# Patient Record
Sex: Female | Born: 1940 | Race: White | Hispanic: No | Marital: Married
Health system: Southern US, Community
[De-identification: ages and names within clinical notes are randomized; demographics above are authoritative.]

## PROBLEM LIST (undated history)

## (undated) HISTORY — PX: BREAST CYST EXCISION: SHX579

---

## 1998-09-11 ENCOUNTER — Encounter: Admission: RE | Admit: 1998-09-11 | Discharge: 1998-12-10 | Payer: Self-pay | Admitting: Internal Medicine

## 1999-06-19 ENCOUNTER — Other Ambulatory Visit: Admission: RE | Admit: 1999-06-19 | Discharge: 1999-06-19 | Payer: Self-pay | Admitting: *Deleted

## 2001-07-01 ENCOUNTER — Other Ambulatory Visit: Admission: RE | Admit: 2001-07-01 | Discharge: 2001-07-01 | Payer: Self-pay | Admitting: *Deleted

## 2003-02-05 ENCOUNTER — Ambulatory Visit (HOSPITAL_COMMUNITY): Admission: RE | Admit: 2003-02-05 | Discharge: 2003-02-05 | Payer: Self-pay | Admitting: Gastroenterology

## 2006-09-06 ENCOUNTER — Emergency Department (HOSPITAL_COMMUNITY): Admission: EM | Admit: 2006-09-06 | Discharge: 2006-09-06 | Payer: Self-pay | Admitting: Emergency Medicine

## 2009-04-16 ENCOUNTER — Encounter: Admission: RE | Admit: 2009-04-16 | Discharge: 2009-04-16 | Payer: Self-pay | Admitting: Obstetrics

## 2010-03-05 ENCOUNTER — Encounter: Admission: RE | Admit: 2010-03-05 | Discharge: 2010-03-05 | Payer: Self-pay | Admitting: Obstetrics

## 2011-02-06 ENCOUNTER — Other Ambulatory Visit: Payer: Self-pay | Admitting: Obstetrics

## 2011-02-06 DIAGNOSIS — Z1231 Encounter for screening mammogram for malignant neoplasm of breast: Secondary | ICD-10-CM

## 2011-02-06 NOTE — Op Note (Signed)
NAME:  BALERIA, WYMAN                              ACCOUNT NO.:  [id number]   MEDICAL RECORD NO.:  [id number]                   PATIENT TYPE:  AMB   LOCATION:  ENDO                                 FACILITY:  [facility]   PHYSICIAN:  Anselmo Rod, M.D.               DATE OF BIRTH:  12/25/1940   DATE OF PROCEDURE:  02/05/2003  DATE OF DISCHARGE:                                 OPERATIVE REPORT   PROCEDURE PERFORMED:  Screening colonoscopy.   ENDOSCOPIST:  Anselmo Rod, M.D.   INSTRUMENT USED:  Olympus video colonoscope.   INDICATIONS FOR PROCEDURE:  A 70 year old white female with a personal  history of insulin-dependent diabetes undergoing screening colonoscopy to  rule out colonic polyps, masses, etc.   PREPROCEDURE PREPARATION:  Informed consent was secured from the patient.  The patient fasted for 8 hours prior to the procedure and prepared with a  bottle of magnesium citrate and a gallon of GoLytely the night prior to  procedure.   PREPROCEDURE PHYSICAL:  VITAL SIGNS:  The patient had stable vital signs.  NECK:  Supple.  CHEST:  Clear to auscultation.  HEART:  S1, S2 regular.  ABDOMEN:  Soft with normal bowel sounds.  Insulin pump present.   DESCRIPTION OF PROCEDURE:  The patient was placed in the left lateral  decubitus position and sedated with 50 mg of Demerol and 5 mg of Versed  intravenously.  Once the patient was adequately sedated and maintained on  low-flow oxygen, and continuous cardiac monitoring, the Olympus video  colonoscope was advanced into the rectum to the cecum and terminal ileum  with difficulty.  The entire colonic mucosa appeared healthy with a normal  vascular pattern.  No masses, polyps, erosions, ulcerations, or diverticula  were seen.  The appendiceal orifice and ileocecal valve were clearly  visualized and photographed.  The terminal ileum appeared healthy and  without lesions.  Small nonbleeding internal hemorrhoids were seen on  retroflexion of  the rectum.  The patient tolerated the procedure well  without complications.   IMPRESSION:  Normal colonoscopy up to the terminal ileum except for small  internal hemorrhoids.   RECOMMENDATIONS:  1. A high fiber diet with liberal fluid intake has been recommended.  2.     Repeat colorectal screening is recommended in the next 10 years unless the     patient develops any abnormal symptoms in the interim.  3. Outpatient followup on a p.r.n. basis.                                               Anselmo Rod, M.D.    JNM/MEDQ  D:  02/05/2003  T:  02/05/2003  Job:  [id number]   cc:   Gerri Spore B. Earlene Plater, M.D.  [address]  [place]  [place] [address]  Fax: [phone]

## 2011-03-10 ENCOUNTER — Ambulatory Visit: Payer: Self-pay

## 2011-03-24 ENCOUNTER — Ambulatory Visit
Admission: RE | Admit: 2011-03-24 | Discharge: 2011-03-24 | Disposition: A | Discharge status: Home / Self Care | Payer: Medicare Other | Source: Ambulatory Visit | Attending: Obstetrics | Admitting: Obstetrics

## 2011-03-24 DIAGNOSIS — Z1231 Encounter for screening mammogram for malignant neoplasm of breast: Secondary | ICD-10-CM

## 2012-02-17 ENCOUNTER — Other Ambulatory Visit: Payer: Self-pay | Admitting: Obstetrics

## 2012-02-17 DIAGNOSIS — Z1231 Encounter for screening mammogram for malignant neoplasm of breast: Secondary | ICD-10-CM

## 2012-03-31 ENCOUNTER — Ambulatory Visit
Admission: RE | Admit: 2012-03-31 | Discharge: 2012-03-31 | Disposition: A | Discharge status: Home / Self Care | Payer: BC Managed Care – PPO | Source: Ambulatory Visit | Attending: Obstetrics | Admitting: Obstetrics

## 2012-03-31 DIAGNOSIS — Z1231 Encounter for screening mammogram for malignant neoplasm of breast: Secondary | ICD-10-CM

## 2013-02-21 ENCOUNTER — Other Ambulatory Visit: Payer: Self-pay

## 2013-02-21 DIAGNOSIS — Z1231 Encounter for screening mammogram for malignant neoplasm of breast: Secondary | ICD-10-CM

## 2013-04-06 ENCOUNTER — Ambulatory Visit
Admission: RE | Admit: 2013-04-06 | Discharge: 2013-04-06 | Disposition: A | Discharge status: Home / Self Care | Payer: Medicare Other | Source: Ambulatory Visit

## 2013-04-06 DIAGNOSIS — Z1231 Encounter for screening mammogram for malignant neoplasm of breast: Secondary | ICD-10-CM

## 2014-02-28 ENCOUNTER — Other Ambulatory Visit: Payer: Self-pay

## 2014-02-28 DIAGNOSIS — Z1231 Encounter for screening mammogram for malignant neoplasm of breast: Secondary | ICD-10-CM

## 2014-04-09 ENCOUNTER — Encounter (INDEPENDENT_AMBULATORY_CARE_PROVIDER_SITE_OTHER): Payer: Self-pay

## 2014-04-09 ENCOUNTER — Ambulatory Visit
Admission: RE | Admit: 2014-04-09 | Discharge: 2014-04-09 | Disposition: A | Discharge status: Home / Self Care | Payer: 59 | Source: Ambulatory Visit

## 2014-04-09 DIAGNOSIS — Z1231 Encounter for screening mammogram for malignant neoplasm of breast: Secondary | ICD-10-CM

## 2014-04-12 ENCOUNTER — Ambulatory Visit: Payer: Medicare Other

## 2014-06-15 ENCOUNTER — Ambulatory Visit: Payer: Self-pay | Admitting: Unknown Physician Specialty

## 2014-09-20 ENCOUNTER — Encounter: Payer: Self-pay | Admitting: Unknown Physician Specialty

## 2014-09-21 ENCOUNTER — Encounter: Payer: Self-pay | Admitting: Unknown Physician Specialty

## 2014-10-22 ENCOUNTER — Encounter: Payer: Self-pay | Admitting: Unknown Physician Specialty

## 2014-11-20 ENCOUNTER — Encounter: Payer: Self-pay | Admitting: Unknown Physician Specialty

## 2015-03-15 ENCOUNTER — Other Ambulatory Visit: Payer: Self-pay

## 2015-03-15 DIAGNOSIS — Z1231 Encounter for screening mammogram for malignant neoplasm of breast: Secondary | ICD-10-CM

## 2015-04-15 ENCOUNTER — Ambulatory Visit
Admission: RE | Admit: 2015-04-15 | Discharge: 2015-04-15 | Disposition: A | Discharge status: Home / Self Care | Payer: Medicare Other | Source: Ambulatory Visit

## 2015-04-15 DIAGNOSIS — Z1231 Encounter for screening mammogram for malignant neoplasm of breast: Secondary | ICD-10-CM

## 2016-04-29 ENCOUNTER — Other Ambulatory Visit: Payer: Self-pay | Admitting: Internal Medicine

## 2016-04-29 DIAGNOSIS — Z1231 Encounter for screening mammogram for malignant neoplasm of breast: Secondary | ICD-10-CM

## 2016-05-01 ENCOUNTER — Ambulatory Visit
Admission: RE | Admit: 2016-05-01 | Discharge: 2016-05-01 | Disposition: A | Discharge status: Home / Self Care | Payer: Medicare Other | Source: Ambulatory Visit | Attending: Internal Medicine | Admitting: Internal Medicine

## 2016-05-01 DIAGNOSIS — Z1231 Encounter for screening mammogram for malignant neoplasm of breast: Secondary | ICD-10-CM

## 2017-06-11 ENCOUNTER — Other Ambulatory Visit: Payer: Self-pay | Admitting: Internal Medicine

## 2017-06-11 DIAGNOSIS — Z1231 Encounter for screening mammogram for malignant neoplasm of breast: Secondary | ICD-10-CM

## 2017-06-18 ENCOUNTER — Ambulatory Visit
Admission: RE | Admit: 2017-06-18 | Discharge: 2017-06-18 | Disposition: A | Discharge status: Home / Self Care | Payer: Medicare Other | Source: Ambulatory Visit | Attending: Internal Medicine | Admitting: Internal Medicine

## 2017-06-18 DIAGNOSIS — Z1231 Encounter for screening mammogram for malignant neoplasm of breast: Secondary | ICD-10-CM

## 2017-08-16 ENCOUNTER — Telehealth: Payer: Self-pay | Admitting: Podiatry

## 2017-08-16 NOTE — Telephone Encounter (Signed)
The purpose of this call is to know the first and last date that I saw Dr. Charlsie Merlesegal which I think was around 2003. I would like to know if I had injured my toe and what he said. You can call me back at [phone]. Thanks for your help and I hope to hear from you soon.

## 2017-08-16 NOTE — Telephone Encounter (Signed)
Tried calling pt back in regards to messages left. Left a voicemail letting the pt know that our old system before 21 June 2013 has crashed and that we do not have access to those records. I informed her I called the facility where are records are stored and they do not have a file on her but also I do not specific information to give them to look directly in an area for her chart, but that by pulling her name they did not have anything. I also told her that normally after 7 years they can destroy the records, that does not mean that they have been but I cannot say for sure. I apologized for any inconvenience this may have caused her and told her to call me with any questions. I gave her my direct phone number.

## 2017-08-16 NOTE — Telephone Encounter (Signed)
I'm a former pt of Dr. Beverlee Nimsegal's. I was wanting to know the first and last appointment with Dr. Charlsie Merlesegal. If you could call and let me know my first and last visit with Dr. Charlsie Merlesegal as well as the diagnosis. My number is [phone]. The last time I was in your office was 2003 I believe. Thanks for your help.

## 2018-05-19 ENCOUNTER — Other Ambulatory Visit: Payer: Self-pay | Admitting: Internal Medicine

## 2018-05-19 DIAGNOSIS — Z1231 Encounter for screening mammogram for malignant neoplasm of breast: Secondary | ICD-10-CM

## 2018-06-23 ENCOUNTER — Ambulatory Visit
Admission: RE | Admit: 2018-06-23 | Discharge: 2018-06-23 | Disposition: A | Discharge status: Home / Self Care | Payer: Medicare Other | Source: Ambulatory Visit | Attending: Internal Medicine | Admitting: Internal Medicine

## 2018-06-23 ENCOUNTER — Ambulatory Visit: Payer: Medicare Other

## 2018-06-23 DIAGNOSIS — Z1231 Encounter for screening mammogram for malignant neoplasm of breast: Secondary | ICD-10-CM

## 2019-05-15 ENCOUNTER — Other Ambulatory Visit: Payer: Self-pay | Admitting: Internal Medicine

## 2019-05-15 DIAGNOSIS — Z1231 Encounter for screening mammogram for malignant neoplasm of breast: Secondary | ICD-10-CM

## 2019-06-30 ENCOUNTER — Ambulatory Visit
Admission: RE | Admit: 2019-06-30 | Discharge: 2019-06-30 | Disposition: A | Discharge status: Home / Self Care | Payer: Medicare Other | Source: Ambulatory Visit | Attending: Internal Medicine | Admitting: Internal Medicine

## 2019-06-30 ENCOUNTER — Other Ambulatory Visit: Payer: Self-pay

## 2019-06-30 DIAGNOSIS — Z1231 Encounter for screening mammogram for malignant neoplasm of breast: Secondary | ICD-10-CM

## 2020-04-08 DIAGNOSIS — E871 Hypo-osmolality and hyponatremia: Secondary | ICD-10-CM | POA: Diagnosis not present

## 2020-04-08 DIAGNOSIS — E10319 Type 1 diabetes mellitus with unspecified diabetic retinopathy without macular edema: Secondary | ICD-10-CM | POA: Diagnosis not present

## 2020-04-08 DIAGNOSIS — Z794 Long term (current) use of insulin: Secondary | ICD-10-CM | POA: Diagnosis not present

## 2020-04-08 DIAGNOSIS — I951 Orthostatic hypotension: Secondary | ICD-10-CM | POA: Diagnosis not present

## 2020-04-08 DIAGNOSIS — I77810 Thoracic aortic ectasia: Secondary | ICD-10-CM | POA: Diagnosis not present

## 2020-04-08 DIAGNOSIS — M25561 Pain in right knee: Secondary | ICD-10-CM | POA: Diagnosis not present

## 2020-04-10 DIAGNOSIS — H35372 Puckering of macula, left eye: Secondary | ICD-10-CM | POA: Diagnosis not present

## 2020-04-10 DIAGNOSIS — E103552 Type 1 diabetes mellitus with stable proliferative diabetic retinopathy, left eye: Secondary | ICD-10-CM | POA: Diagnosis not present

## 2020-04-10 DIAGNOSIS — H353121 Nonexudative age-related macular degeneration, left eye, early dry stage: Secondary | ICD-10-CM | POA: Diagnosis not present

## 2020-04-10 DIAGNOSIS — H43822 Vitreomacular adhesion, left eye: Secondary | ICD-10-CM | POA: Diagnosis not present

## 2020-04-16 DIAGNOSIS — E109 Type 1 diabetes mellitus without complications: Secondary | ICD-10-CM | POA: Diagnosis not present

## 2020-05-31 ENCOUNTER — Other Ambulatory Visit: Payer: Self-pay | Admitting: Internal Medicine

## 2020-05-31 DIAGNOSIS — Z1231 Encounter for screening mammogram for malignant neoplasm of breast: Secondary | ICD-10-CM

## 2020-07-09 ENCOUNTER — Ambulatory Visit
Admission: RE | Admit: 2020-07-09 | Discharge: 2020-07-09 | Disposition: A | Discharge status: Home / Self Care | Payer: Medicare Other | Source: Ambulatory Visit | Attending: Internal Medicine | Admitting: Internal Medicine

## 2020-07-09 ENCOUNTER — Other Ambulatory Visit: Payer: Self-pay

## 2020-07-09 DIAGNOSIS — M810 Age-related osteoporosis without current pathological fracture: Secondary | ICD-10-CM | POA: Diagnosis not present

## 2020-07-09 DIAGNOSIS — Z1231 Encounter for screening mammogram for malignant neoplasm of breast: Secondary | ICD-10-CM

## 2020-07-09 DIAGNOSIS — E10319 Type 1 diabetes mellitus with unspecified diabetic retinopathy without macular edema: Secondary | ICD-10-CM | POA: Diagnosis not present

## 2020-07-09 DIAGNOSIS — Z0000 Encounter for general adult medical examination without abnormal findings: Secondary | ICD-10-CM | POA: Diagnosis not present

## 2020-07-09 DIAGNOSIS — I77810 Thoracic aortic ectasia: Secondary | ICD-10-CM | POA: Diagnosis not present

## 2020-07-09 DIAGNOSIS — Z794 Long term (current) use of insulin: Secondary | ICD-10-CM | POA: Diagnosis not present

## 2020-07-09 DIAGNOSIS — E871 Hypo-osmolality and hyponatremia: Secondary | ICD-10-CM | POA: Diagnosis not present

## 2020-07-09 DIAGNOSIS — D692 Other nonthrombocytopenic purpura: Secondary | ICD-10-CM | POA: Diagnosis not present

## 2020-07-09 DIAGNOSIS — I951 Orthostatic hypotension: Secondary | ICD-10-CM | POA: Diagnosis not present

## 2020-07-09 DIAGNOSIS — E103599 Type 1 diabetes mellitus with proliferative diabetic retinopathy without macular edema, unspecified eye: Secondary | ICD-10-CM | POA: Diagnosis not present

## 2020-07-10 DIAGNOSIS — H40022 Open angle with borderline findings, high risk, left eye: Secondary | ICD-10-CM | POA: Diagnosis not present

## 2020-07-11 DIAGNOSIS — Z1212 Encounter for screening for malignant neoplasm of rectum: Secondary | ICD-10-CM | POA: Diagnosis not present

## 2020-07-11 DIAGNOSIS — R82998 Other abnormal findings in urine: Secondary | ICD-10-CM | POA: Diagnosis not present

## 2020-10-07 DIAGNOSIS — E109 Type 1 diabetes mellitus without complications: Secondary | ICD-10-CM | POA: Diagnosis not present

## 2020-10-18 DIAGNOSIS — Z794 Long term (current) use of insulin: Secondary | ICD-10-CM | POA: Diagnosis not present

## 2020-10-18 DIAGNOSIS — E103599 Type 1 diabetes mellitus with proliferative diabetic retinopathy without macular edema, unspecified eye: Secondary | ICD-10-CM | POA: Diagnosis not present

## 2020-10-18 DIAGNOSIS — E10319 Type 1 diabetes mellitus with unspecified diabetic retinopathy without macular edema: Secondary | ICD-10-CM | POA: Diagnosis not present

## 2020-10-18 DIAGNOSIS — I951 Orthostatic hypotension: Secondary | ICD-10-CM | POA: Diagnosis not present

## 2020-10-18 DIAGNOSIS — I77810 Thoracic aortic ectasia: Secondary | ICD-10-CM | POA: Diagnosis not present

## 2020-10-18 DIAGNOSIS — E109 Type 1 diabetes mellitus without complications: Secondary | ICD-10-CM | POA: Diagnosis not present

## 2020-10-18 DIAGNOSIS — H3530 Unspecified macular degeneration: Secondary | ICD-10-CM | POA: Diagnosis not present

## 2020-10-18 DIAGNOSIS — M810 Age-related osteoporosis without current pathological fracture: Secondary | ICD-10-CM | POA: Diagnosis not present

## 2020-10-18 DIAGNOSIS — D329 Benign neoplasm of meninges, unspecified: Secondary | ICD-10-CM | POA: Diagnosis not present

## 2020-10-18 DIAGNOSIS — D692 Other nonthrombocytopenic purpura: Secondary | ICD-10-CM | POA: Diagnosis not present

## 2020-12-24 DIAGNOSIS — E1065 Type 1 diabetes mellitus with hyperglycemia: Secondary | ICD-10-CM | POA: Diagnosis not present

## 2020-12-24 DIAGNOSIS — E133519 Other specified diabetes mellitus with proliferative diabetic retinopathy with macular edema, unspecified eye: Secondary | ICD-10-CM | POA: Diagnosis not present

## 2020-12-24 DIAGNOSIS — E559 Vitamin D deficiency, unspecified: Secondary | ICD-10-CM | POA: Diagnosis not present

## 2020-12-24 DIAGNOSIS — E1042 Type 1 diabetes mellitus with diabetic polyneuropathy: Secondary | ICD-10-CM | POA: Diagnosis not present

## 2020-12-24 DIAGNOSIS — R5383 Other fatigue: Secondary | ICD-10-CM | POA: Diagnosis not present

## 2021-01-06 DIAGNOSIS — E109 Type 1 diabetes mellitus without complications: Secondary | ICD-10-CM | POA: Diagnosis not present

## 2021-04-16 DIAGNOSIS — E1041 Type 1 diabetes mellitus with diabetic mononeuropathy: Secondary | ICD-10-CM | POA: Diagnosis not present

## 2021-04-16 DIAGNOSIS — E1065 Type 1 diabetes mellitus with hyperglycemia: Secondary | ICD-10-CM | POA: Diagnosis not present

## 2021-04-16 DIAGNOSIS — E133519 Other specified diabetes mellitus with proliferative diabetic retinopathy with macular edema, unspecified eye: Secondary | ICD-10-CM | POA: Diagnosis not present

## 2021-04-29 DIAGNOSIS — E109 Type 1 diabetes mellitus without complications: Secondary | ICD-10-CM | POA: Diagnosis not present

## 2021-04-30 DIAGNOSIS — H353121 Nonexudative age-related macular degeneration, left eye, early dry stage: Secondary | ICD-10-CM | POA: Diagnosis not present

## 2021-04-30 DIAGNOSIS — H40022 Open angle with borderline findings, high risk, left eye: Secondary | ICD-10-CM | POA: Diagnosis not present

## 2021-04-30 DIAGNOSIS — E103552 Type 1 diabetes mellitus with stable proliferative diabetic retinopathy, left eye: Secondary | ICD-10-CM | POA: Diagnosis not present

## 2021-04-30 DIAGNOSIS — H35372 Puckering of macula, left eye: Secondary | ICD-10-CM | POA: Diagnosis not present

## 2021-04-30 DIAGNOSIS — S0571XD Avulsion of right eye, subsequent encounter: Secondary | ICD-10-CM | POA: Diagnosis not present

## 2021-04-30 DIAGNOSIS — H43822 Vitreomacular adhesion, left eye: Secondary | ICD-10-CM | POA: Diagnosis not present

## 2021-06-03 ENCOUNTER — Other Ambulatory Visit: Payer: Self-pay | Admitting: Internal Medicine

## 2021-06-03 DIAGNOSIS — Z1231 Encounter for screening mammogram for malignant neoplasm of breast: Secondary | ICD-10-CM

## 2021-07-16 ENCOUNTER — Ambulatory Visit: Payer: Medicare PPO

## 2021-07-17 ENCOUNTER — Ambulatory Visit
Admission: RE | Admit: 2021-07-17 | Discharge: 2021-07-17 | Disposition: A | Discharge status: Home / Self Care | Payer: Medicare PPO | Source: Ambulatory Visit | Attending: Internal Medicine | Admitting: Internal Medicine

## 2021-07-17 ENCOUNTER — Other Ambulatory Visit: Payer: Self-pay

## 2021-07-17 DIAGNOSIS — Z1231 Encounter for screening mammogram for malignant neoplasm of breast: Secondary | ICD-10-CM | POA: Diagnosis not present

## 2021-07-30 DIAGNOSIS — E109 Type 1 diabetes mellitus without complications: Secondary | ICD-10-CM | POA: Diagnosis not present

## 2021-08-25 DIAGNOSIS — E1065 Type 1 diabetes mellitus with hyperglycemia: Secondary | ICD-10-CM | POA: Diagnosis not present

## 2021-08-25 DIAGNOSIS — E1043 Type 1 diabetes mellitus with diabetic autonomic (poly)neuropathy: Secondary | ICD-10-CM | POA: Diagnosis not present

## 2021-08-25 DIAGNOSIS — E559 Vitamin D deficiency, unspecified: Secondary | ICD-10-CM | POA: Diagnosis not present

## 2021-08-25 DIAGNOSIS — R5383 Other fatigue: Secondary | ICD-10-CM | POA: Diagnosis not present

## 2021-09-23 DIAGNOSIS — H35372 Puckering of macula, left eye: Secondary | ICD-10-CM | POA: Diagnosis not present

## 2021-09-23 DIAGNOSIS — E103552 Type 1 diabetes mellitus with stable proliferative diabetic retinopathy, left eye: Secondary | ICD-10-CM | POA: Diagnosis not present

## 2021-09-23 DIAGNOSIS — H43822 Vitreomacular adhesion, left eye: Secondary | ICD-10-CM | POA: Diagnosis not present

## 2021-09-23 DIAGNOSIS — H353121 Nonexudative age-related macular degeneration, left eye, early dry stage: Secondary | ICD-10-CM | POA: Diagnosis not present

## 2021-09-23 DIAGNOSIS — H40022 Open angle with borderline findings, high risk, left eye: Secondary | ICD-10-CM | POA: Diagnosis not present

## 2021-11-17 DIAGNOSIS — E109 Type 1 diabetes mellitus without complications: Secondary | ICD-10-CM | POA: Diagnosis not present

## 2021-12-11 DIAGNOSIS — E1065 Type 1 diabetes mellitus with hyperglycemia: Secondary | ICD-10-CM | POA: Diagnosis not present

## 2021-12-11 DIAGNOSIS — M816 Localized osteoporosis [Lequesne]: Secondary | ICD-10-CM | POA: Diagnosis not present

## 2021-12-17 DIAGNOSIS — M816 Localized osteoporosis [Lequesne]: Secondary | ICD-10-CM | POA: Diagnosis not present

## 2022-02-17 DIAGNOSIS — E109 Type 1 diabetes mellitus without complications: Secondary | ICD-10-CM | POA: Diagnosis not present

## 2022-04-01 DIAGNOSIS — H43822 Vitreomacular adhesion, left eye: Secondary | ICD-10-CM | POA: Diagnosis not present

## 2022-04-01 DIAGNOSIS — E103552 Type 1 diabetes mellitus with stable proliferative diabetic retinopathy, left eye: Secondary | ICD-10-CM | POA: Diagnosis not present

## 2022-04-01 DIAGNOSIS — H40022 Open angle with borderline findings, high risk, left eye: Secondary | ICD-10-CM | POA: Diagnosis not present

## 2022-04-01 DIAGNOSIS — H35372 Puckering of macula, left eye: Secondary | ICD-10-CM | POA: Diagnosis not present

## 2022-04-01 DIAGNOSIS — H353121 Nonexudative age-related macular degeneration, left eye, early dry stage: Secondary | ICD-10-CM | POA: Diagnosis not present

## 2022-04-13 DIAGNOSIS — M8000XS Age-related osteoporosis with current pathological fracture, unspecified site, sequela: Secondary | ICD-10-CM | POA: Diagnosis not present

## 2022-04-13 DIAGNOSIS — E1065 Type 1 diabetes mellitus with hyperglycemia: Secondary | ICD-10-CM | POA: Diagnosis not present

## 2022-04-13 DIAGNOSIS — E1042 Type 1 diabetes mellitus with diabetic polyneuropathy: Secondary | ICD-10-CM | POA: Diagnosis not present

## 2022-04-16 DIAGNOSIS — H903 Sensorineural hearing loss, bilateral: Secondary | ICD-10-CM | POA: Diagnosis not present

## 2022-04-20 DIAGNOSIS — L720 Epidermal cyst: Secondary | ICD-10-CM | POA: Diagnosis not present

## 2022-05-08 DIAGNOSIS — S0571XD Avulsion of right eye, subsequent encounter: Secondary | ICD-10-CM | POA: Diagnosis not present

## 2022-05-19 DIAGNOSIS — E109 Type 1 diabetes mellitus without complications: Secondary | ICD-10-CM | POA: Diagnosis not present

## 2022-06-08 ENCOUNTER — Other Ambulatory Visit: Payer: Self-pay | Admitting: Internal Medicine

## 2022-06-08 DIAGNOSIS — Z1231 Encounter for screening mammogram for malignant neoplasm of breast: Secondary | ICD-10-CM

## 2022-07-31 DIAGNOSIS — E1049 Type 1 diabetes mellitus with other diabetic neurological complication: Secondary | ICD-10-CM | POA: Diagnosis not present

## 2022-07-31 DIAGNOSIS — I2089 Other forms of angina pectoris: Secondary | ICD-10-CM | POA: Diagnosis not present

## 2022-07-31 DIAGNOSIS — Z8249 Family history of ischemic heart disease and other diseases of the circulatory system: Secondary | ICD-10-CM | POA: Diagnosis not present

## 2022-07-31 DIAGNOSIS — R55 Syncope and collapse: Secondary | ICD-10-CM | POA: Diagnosis not present

## 2022-07-31 DIAGNOSIS — Z136 Encounter for screening for cardiovascular disorders: Secondary | ICD-10-CM | POA: Diagnosis not present

## 2022-08-03 DIAGNOSIS — E1065 Type 1 diabetes mellitus with hyperglycemia: Secondary | ICD-10-CM | POA: Diagnosis not present

## 2022-08-03 DIAGNOSIS — E559 Vitamin D deficiency, unspecified: Secondary | ICD-10-CM | POA: Diagnosis not present

## 2022-08-03 DIAGNOSIS — R5383 Other fatigue: Secondary | ICD-10-CM | POA: Diagnosis not present

## 2022-08-24 ENCOUNTER — Ambulatory Visit: Payer: Medicare PPO

## 2022-08-25 ENCOUNTER — Ambulatory Visit
Admission: RE | Admit: 2022-08-25 | Discharge: 2022-08-25 | Disposition: A | Discharge status: Home / Self Care | Payer: Medicare PPO | Source: Ambulatory Visit | Attending: Internal Medicine | Admitting: Internal Medicine

## 2022-08-25 DIAGNOSIS — Z1231 Encounter for screening mammogram for malignant neoplasm of breast: Secondary | ICD-10-CM

## 2022-09-01 DIAGNOSIS — E109 Type 1 diabetes mellitus without complications: Secondary | ICD-10-CM | POA: Diagnosis not present

## 2022-11-05 DIAGNOSIS — M810 Age-related osteoporosis without current pathological fracture: Secondary | ICD-10-CM | POA: Diagnosis not present

## 2022-11-05 DIAGNOSIS — E1065 Type 1 diabetes mellitus with hyperglycemia: Secondary | ICD-10-CM | POA: Diagnosis not present

## 2022-11-08 DIAGNOSIS — R42 Dizziness and giddiness: Secondary | ICD-10-CM | POA: Diagnosis not present

## 2022-11-08 DIAGNOSIS — R278 Other lack of coordination: Secondary | ICD-10-CM | POA: Diagnosis not present

## 2022-11-08 DIAGNOSIS — I517 Cardiomegaly: Secondary | ICD-10-CM | POA: Diagnosis not present

## 2022-11-08 DIAGNOSIS — Z880 Allergy status to penicillin: Secondary | ICD-10-CM | POA: Diagnosis not present

## 2022-11-08 DIAGNOSIS — R930 Abnormal findings on diagnostic imaging of skull and head, not elsewhere classified: Secondary | ICD-10-CM | POA: Diagnosis not present

## 2022-11-08 DIAGNOSIS — R29818 Other symptoms and signs involving the nervous system: Secondary | ICD-10-CM | POA: Diagnosis not present

## 2022-11-08 DIAGNOSIS — I6389 Other cerebral infarction: Secondary | ICD-10-CM | POA: Diagnosis not present

## 2022-11-08 DIAGNOSIS — R270 Ataxia, unspecified: Secondary | ICD-10-CM | POA: Diagnosis not present

## 2022-11-08 DIAGNOSIS — I10 Essential (primary) hypertension: Secondary | ICD-10-CM | POA: Diagnosis not present

## 2022-11-08 DIAGNOSIS — E119 Type 2 diabetes mellitus without complications: Secondary | ICD-10-CM | POA: Diagnosis not present

## 2022-11-08 DIAGNOSIS — D329 Benign neoplasm of meninges, unspecified: Secondary | ICD-10-CM | POA: Diagnosis not present

## 2022-11-08 DIAGNOSIS — R9431 Abnormal electrocardiogram [ECG] [EKG]: Secondary | ICD-10-CM | POA: Diagnosis not present

## 2022-11-08 DIAGNOSIS — I773 Arterial fibromuscular dysplasia: Secondary | ICD-10-CM | POA: Diagnosis not present

## 2022-11-08 DIAGNOSIS — E785 Hyperlipidemia, unspecified: Secondary | ICD-10-CM | POA: Diagnosis not present

## 2022-11-08 DIAGNOSIS — I639 Cerebral infarction, unspecified: Secondary | ICD-10-CM | POA: Diagnosis not present

## 2022-11-08 DIAGNOSIS — E1069 Type 1 diabetes mellitus with other specified complication: Secondary | ICD-10-CM | POA: Diagnosis not present

## 2022-11-08 DIAGNOSIS — I6381 Other cerebral infarction due to occlusion or stenosis of small artery: Secondary | ICD-10-CM | POA: Diagnosis not present

## 2022-11-08 DIAGNOSIS — Z794 Long term (current) use of insulin: Secondary | ICD-10-CM | POA: Diagnosis not present

## 2022-11-08 DIAGNOSIS — I69351 Hemiplegia and hemiparesis following cerebral infarction affecting right dominant side: Secondary | ICD-10-CM | POA: Diagnosis not present

## 2022-11-08 DIAGNOSIS — Z7982 Long term (current) use of aspirin: Secondary | ICD-10-CM | POA: Diagnosis not present

## 2022-11-08 DIAGNOSIS — M810 Age-related osteoporosis without current pathological fracture: Secondary | ICD-10-CM | POA: Diagnosis not present

## 2022-11-08 DIAGNOSIS — Z79899 Other long term (current) drug therapy: Secondary | ICD-10-CM | POA: Diagnosis not present

## 2022-11-08 DIAGNOSIS — I361 Nonrheumatic tricuspid (valve) insufficiency: Secondary | ICD-10-CM | POA: Diagnosis not present

## 2022-11-08 DIAGNOSIS — R5383 Other fatigue: Secondary | ICD-10-CM | POA: Diagnosis not present

## 2022-11-10 DIAGNOSIS — D329 Benign neoplasm of meninges, unspecified: Secondary | ICD-10-CM | POA: Diagnosis not present

## 2022-11-10 DIAGNOSIS — Z5181 Encounter for therapeutic drug level monitoring: Secondary | ICD-10-CM | POA: Diagnosis not present

## 2022-11-10 DIAGNOSIS — I6389 Other cerebral infarction: Secondary | ICD-10-CM | POA: Diagnosis not present

## 2022-11-10 DIAGNOSIS — I639 Cerebral infarction, unspecified: Secondary | ICD-10-CM | POA: Diagnosis not present

## 2022-11-10 DIAGNOSIS — R9431 Abnormal electrocardiogram [ECG] [EKG]: Secondary | ICD-10-CM | POA: Diagnosis not present

## 2022-11-10 DIAGNOSIS — E1069 Type 1 diabetes mellitus with other specified complication: Secondary | ICD-10-CM | POA: Diagnosis not present

## 2022-11-10 DIAGNOSIS — I517 Cardiomegaly: Secondary | ICD-10-CM | POA: Diagnosis not present

## 2022-11-10 DIAGNOSIS — Z79899 Other long term (current) drug therapy: Secondary | ICD-10-CM | POA: Diagnosis not present

## 2022-11-10 DIAGNOSIS — I773 Arterial fibromuscular dysplasia: Secondary | ICD-10-CM | POA: Diagnosis not present

## 2022-11-10 DIAGNOSIS — R270 Ataxia, unspecified: Secondary | ICD-10-CM | POA: Diagnosis not present

## 2022-11-10 DIAGNOSIS — E119 Type 2 diabetes mellitus without complications: Secondary | ICD-10-CM | POA: Diagnosis not present

## 2022-11-10 DIAGNOSIS — R531 Weakness: Secondary | ICD-10-CM | POA: Diagnosis not present

## 2022-11-10 DIAGNOSIS — E785 Hyperlipidemia, unspecified: Secondary | ICD-10-CM | POA: Diagnosis not present

## 2022-11-10 DIAGNOSIS — I10 Essential (primary) hypertension: Secondary | ICD-10-CM | POA: Diagnosis not present

## 2022-11-10 DIAGNOSIS — I69351 Hemiplegia and hemiparesis following cerebral infarction affecting right dominant side: Secondary | ICD-10-CM | POA: Diagnosis not present

## 2022-11-10 DIAGNOSIS — M810 Age-related osteoporosis without current pathological fracture: Secondary | ICD-10-CM | POA: Diagnosis not present

## 2022-11-10 DIAGNOSIS — I6381 Other cerebral infarction due to occlusion or stenosis of small artery: Secondary | ICD-10-CM | POA: Diagnosis not present

## 2022-11-10 DIAGNOSIS — R278 Other lack of coordination: Secondary | ICD-10-CM | POA: Diagnosis not present

## 2022-11-11 DIAGNOSIS — E119 Type 2 diabetes mellitus without complications: Secondary | ICD-10-CM | POA: Diagnosis not present

## 2022-11-11 DIAGNOSIS — I6381 Other cerebral infarction due to occlusion or stenosis of small artery: Secondary | ICD-10-CM | POA: Diagnosis not present

## 2022-11-11 DIAGNOSIS — I773 Arterial fibromuscular dysplasia: Secondary | ICD-10-CM | POA: Diagnosis not present

## 2022-11-11 DIAGNOSIS — I10 Essential (primary) hypertension: Secondary | ICD-10-CM | POA: Diagnosis not present

## 2022-11-12 DIAGNOSIS — I10 Essential (primary) hypertension: Secondary | ICD-10-CM | POA: Diagnosis not present

## 2022-11-12 DIAGNOSIS — E1069 Type 1 diabetes mellitus with other specified complication: Secondary | ICD-10-CM | POA: Diagnosis not present

## 2022-11-12 DIAGNOSIS — I639 Cerebral infarction, unspecified: Secondary | ICD-10-CM | POA: Diagnosis not present

## 2022-11-12 DIAGNOSIS — R531 Weakness: Secondary | ICD-10-CM | POA: Diagnosis not present

## 2022-11-20 DIAGNOSIS — Z79899 Other long term (current) drug therapy: Secondary | ICD-10-CM | POA: Diagnosis not present

## 2022-11-20 DIAGNOSIS — Z5181 Encounter for therapeutic drug level monitoring: Secondary | ICD-10-CM | POA: Diagnosis not present

## 2022-11-23 DIAGNOSIS — Z79899 Other long term (current) drug therapy: Secondary | ICD-10-CM | POA: Diagnosis not present

## 2022-11-23 DIAGNOSIS — Z5181 Encounter for therapeutic drug level monitoring: Secondary | ICD-10-CM | POA: Diagnosis not present

## 2022-11-24 DIAGNOSIS — Z5181 Encounter for therapeutic drug level monitoring: Secondary | ICD-10-CM | POA: Diagnosis not present

## 2022-11-24 DIAGNOSIS — Z79899 Other long term (current) drug therapy: Secondary | ICD-10-CM | POA: Diagnosis not present

## 2022-11-25 DIAGNOSIS — Z5181 Encounter for therapeutic drug level monitoring: Secondary | ICD-10-CM | POA: Diagnosis not present

## 2022-11-25 DIAGNOSIS — Z79899 Other long term (current) drug therapy: Secondary | ICD-10-CM | POA: Diagnosis not present

## 2022-11-30 DIAGNOSIS — I6381 Other cerebral infarction due to occlusion or stenosis of small artery: Secondary | ICD-10-CM | POA: Diagnosis not present

## 2022-11-30 DIAGNOSIS — M810 Age-related osteoporosis without current pathological fracture: Secondary | ICD-10-CM | POA: Diagnosis not present

## 2022-11-30 DIAGNOSIS — R270 Ataxia, unspecified: Secondary | ICD-10-CM | POA: Diagnosis not present

## 2022-11-30 DIAGNOSIS — I10 Essential (primary) hypertension: Secondary | ICD-10-CM | POA: Diagnosis not present

## 2022-11-30 DIAGNOSIS — D329 Benign neoplasm of meninges, unspecified: Secondary | ICD-10-CM | POA: Diagnosis not present

## 2022-11-30 DIAGNOSIS — E1069 Type 1 diabetes mellitus with other specified complication: Secondary | ICD-10-CM | POA: Diagnosis not present

## 2022-12-02 DIAGNOSIS — I69341 Monoplegia of lower limb following cerebral infarction affecting right dominant side: Secondary | ICD-10-CM | POA: Diagnosis not present

## 2022-12-02 DIAGNOSIS — Z794 Long term (current) use of insulin: Secondary | ICD-10-CM | POA: Diagnosis not present

## 2022-12-02 DIAGNOSIS — D329 Benign neoplasm of meninges, unspecified: Secondary | ICD-10-CM | POA: Diagnosis not present

## 2022-12-02 DIAGNOSIS — I10 Essential (primary) hypertension: Secondary | ICD-10-CM | POA: Diagnosis not present

## 2022-12-02 DIAGNOSIS — E119 Type 2 diabetes mellitus without complications: Secondary | ICD-10-CM | POA: Diagnosis not present

## 2022-12-02 DIAGNOSIS — Z9641 Presence of insulin pump (external) (internal): Secondary | ICD-10-CM | POA: Diagnosis not present

## 2022-12-02 DIAGNOSIS — I69393 Ataxia following cerebral infarction: Secondary | ICD-10-CM | POA: Diagnosis not present

## 2022-12-02 DIAGNOSIS — Z7902 Long term (current) use of antithrombotics/antiplatelets: Secondary | ICD-10-CM | POA: Diagnosis not present

## 2022-12-02 DIAGNOSIS — R29898 Other symptoms and signs involving the musculoskeletal system: Secondary | ICD-10-CM | POA: Diagnosis not present

## 2022-12-04 DIAGNOSIS — D329 Benign neoplasm of meninges, unspecified: Secondary | ICD-10-CM | POA: Diagnosis not present

## 2022-12-04 DIAGNOSIS — Z794 Long term (current) use of insulin: Secondary | ICD-10-CM | POA: Diagnosis not present

## 2022-12-04 DIAGNOSIS — I10 Essential (primary) hypertension: Secondary | ICD-10-CM | POA: Diagnosis not present

## 2022-12-04 DIAGNOSIS — E119 Type 2 diabetes mellitus without complications: Secondary | ICD-10-CM | POA: Diagnosis not present

## 2022-12-04 DIAGNOSIS — I69341 Monoplegia of lower limb following cerebral infarction affecting right dominant side: Secondary | ICD-10-CM | POA: Diagnosis not present

## 2022-12-04 DIAGNOSIS — Z7902 Long term (current) use of antithrombotics/antiplatelets: Secondary | ICD-10-CM | POA: Diagnosis not present

## 2022-12-04 DIAGNOSIS — I69393 Ataxia following cerebral infarction: Secondary | ICD-10-CM | POA: Diagnosis not present

## 2022-12-04 DIAGNOSIS — Z9641 Presence of insulin pump (external) (internal): Secondary | ICD-10-CM | POA: Diagnosis not present

## 2022-12-04 DIAGNOSIS — R29898 Other symptoms and signs involving the musculoskeletal system: Secondary | ICD-10-CM | POA: Diagnosis not present

## 2022-12-08 DIAGNOSIS — Z7902 Long term (current) use of antithrombotics/antiplatelets: Secondary | ICD-10-CM | POA: Diagnosis not present

## 2022-12-08 DIAGNOSIS — D329 Benign neoplasm of meninges, unspecified: Secondary | ICD-10-CM | POA: Diagnosis not present

## 2022-12-08 DIAGNOSIS — I69393 Ataxia following cerebral infarction: Secondary | ICD-10-CM | POA: Diagnosis not present

## 2022-12-08 DIAGNOSIS — R29898 Other symptoms and signs involving the musculoskeletal system: Secondary | ICD-10-CM | POA: Diagnosis not present

## 2022-12-08 DIAGNOSIS — Z794 Long term (current) use of insulin: Secondary | ICD-10-CM | POA: Diagnosis not present

## 2022-12-08 DIAGNOSIS — I69341 Monoplegia of lower limb following cerebral infarction affecting right dominant side: Secondary | ICD-10-CM | POA: Diagnosis not present

## 2022-12-08 DIAGNOSIS — Z9641 Presence of insulin pump (external) (internal): Secondary | ICD-10-CM | POA: Diagnosis not present

## 2022-12-08 DIAGNOSIS — E119 Type 2 diabetes mellitus without complications: Secondary | ICD-10-CM | POA: Diagnosis not present

## 2022-12-08 DIAGNOSIS — I10 Essential (primary) hypertension: Secondary | ICD-10-CM | POA: Diagnosis not present

## 2022-12-09 DIAGNOSIS — I6381 Other cerebral infarction due to occlusion or stenosis of small artery: Secondary | ICD-10-CM | POA: Diagnosis not present

## 2022-12-09 DIAGNOSIS — E1049 Type 1 diabetes mellitus with other diabetic neurological complication: Secondary | ICD-10-CM | POA: Diagnosis not present

## 2022-12-09 DIAGNOSIS — H9191 Unspecified hearing loss, right ear: Secondary | ICD-10-CM | POA: Diagnosis not present

## 2022-12-09 DIAGNOSIS — D329 Benign neoplasm of meninges, unspecified: Secondary | ICD-10-CM | POA: Diagnosis not present

## 2022-12-09 DIAGNOSIS — H5440 Blindness, one eye, unspecified eye: Secondary | ICD-10-CM | POA: Diagnosis not present

## 2022-12-09 DIAGNOSIS — I773 Arterial fibromuscular dysplasia: Secondary | ICD-10-CM | POA: Diagnosis not present

## 2022-12-09 DIAGNOSIS — I10 Essential (primary) hypertension: Secondary | ICD-10-CM | POA: Diagnosis not present

## 2022-12-11 DIAGNOSIS — Z794 Long term (current) use of insulin: Secondary | ICD-10-CM | POA: Diagnosis not present

## 2022-12-11 DIAGNOSIS — I10 Essential (primary) hypertension: Secondary | ICD-10-CM | POA: Diagnosis not present

## 2022-12-11 DIAGNOSIS — D329 Benign neoplasm of meninges, unspecified: Secondary | ICD-10-CM | POA: Diagnosis not present

## 2022-12-11 DIAGNOSIS — I69341 Monoplegia of lower limb following cerebral infarction affecting right dominant side: Secondary | ICD-10-CM | POA: Diagnosis not present

## 2022-12-11 DIAGNOSIS — E119 Type 2 diabetes mellitus without complications: Secondary | ICD-10-CM | POA: Diagnosis not present

## 2022-12-11 DIAGNOSIS — Z9641 Presence of insulin pump (external) (internal): Secondary | ICD-10-CM | POA: Diagnosis not present

## 2022-12-11 DIAGNOSIS — I69393 Ataxia following cerebral infarction: Secondary | ICD-10-CM | POA: Diagnosis not present

## 2022-12-11 DIAGNOSIS — Z7902 Long term (current) use of antithrombotics/antiplatelets: Secondary | ICD-10-CM | POA: Diagnosis not present

## 2022-12-11 DIAGNOSIS — R29898 Other symptoms and signs involving the musculoskeletal system: Secondary | ICD-10-CM | POA: Diagnosis not present

## 2022-12-14 DIAGNOSIS — D329 Benign neoplasm of meninges, unspecified: Secondary | ICD-10-CM | POA: Diagnosis not present

## 2022-12-14 DIAGNOSIS — R270 Ataxia, unspecified: Secondary | ICD-10-CM | POA: Diagnosis not present

## 2022-12-14 DIAGNOSIS — I2089 Other forms of angina pectoris: Secondary | ICD-10-CM | POA: Diagnosis not present

## 2022-12-14 DIAGNOSIS — I6381 Other cerebral infarction due to occlusion or stenosis of small artery: Secondary | ICD-10-CM | POA: Diagnosis not present

## 2022-12-14 DIAGNOSIS — Z794 Long term (current) use of insulin: Secondary | ICD-10-CM | POA: Diagnosis not present

## 2022-12-14 DIAGNOSIS — E103552 Type 1 diabetes mellitus with stable proliferative diabetic retinopathy, left eye: Secondary | ICD-10-CM | POA: Diagnosis not present

## 2022-12-14 DIAGNOSIS — Z9641 Presence of insulin pump (external) (internal): Secondary | ICD-10-CM | POA: Diagnosis not present

## 2022-12-15 DIAGNOSIS — I69341 Monoplegia of lower limb following cerebral infarction affecting right dominant side: Secondary | ICD-10-CM | POA: Diagnosis not present

## 2022-12-15 DIAGNOSIS — I10 Essential (primary) hypertension: Secondary | ICD-10-CM | POA: Diagnosis not present

## 2022-12-15 DIAGNOSIS — Z794 Long term (current) use of insulin: Secondary | ICD-10-CM | POA: Diagnosis not present

## 2022-12-15 DIAGNOSIS — D329 Benign neoplasm of meninges, unspecified: Secondary | ICD-10-CM | POA: Diagnosis not present

## 2022-12-15 DIAGNOSIS — R29898 Other symptoms and signs involving the musculoskeletal system: Secondary | ICD-10-CM | POA: Diagnosis not present

## 2022-12-15 DIAGNOSIS — I69393 Ataxia following cerebral infarction: Secondary | ICD-10-CM | POA: Diagnosis not present

## 2022-12-15 DIAGNOSIS — E119 Type 2 diabetes mellitus without complications: Secondary | ICD-10-CM | POA: Diagnosis not present

## 2022-12-15 DIAGNOSIS — Z9641 Presence of insulin pump (external) (internal): Secondary | ICD-10-CM | POA: Diagnosis not present

## 2022-12-15 DIAGNOSIS — Z7902 Long term (current) use of antithrombotics/antiplatelets: Secondary | ICD-10-CM | POA: Diagnosis not present

## 2022-12-17 DIAGNOSIS — R29898 Other symptoms and signs involving the musculoskeletal system: Secondary | ICD-10-CM | POA: Diagnosis not present

## 2022-12-17 DIAGNOSIS — Z794 Long term (current) use of insulin: Secondary | ICD-10-CM | POA: Diagnosis not present

## 2022-12-17 DIAGNOSIS — Z9641 Presence of insulin pump (external) (internal): Secondary | ICD-10-CM | POA: Diagnosis not present

## 2022-12-17 DIAGNOSIS — I69393 Ataxia following cerebral infarction: Secondary | ICD-10-CM | POA: Diagnosis not present

## 2022-12-17 DIAGNOSIS — E109 Type 1 diabetes mellitus without complications: Secondary | ICD-10-CM | POA: Diagnosis not present

## 2022-12-17 DIAGNOSIS — I69341 Monoplegia of lower limb following cerebral infarction affecting right dominant side: Secondary | ICD-10-CM | POA: Diagnosis not present

## 2022-12-17 DIAGNOSIS — I10 Essential (primary) hypertension: Secondary | ICD-10-CM | POA: Diagnosis not present

## 2022-12-17 DIAGNOSIS — D329 Benign neoplasm of meninges, unspecified: Secondary | ICD-10-CM | POA: Diagnosis not present

## 2022-12-17 DIAGNOSIS — Z7902 Long term (current) use of antithrombotics/antiplatelets: Secondary | ICD-10-CM | POA: Diagnosis not present

## 2022-12-17 DIAGNOSIS — E119 Type 2 diabetes mellitus without complications: Secondary | ICD-10-CM | POA: Diagnosis not present

## 2022-12-21 DIAGNOSIS — Z7902 Long term (current) use of antithrombotics/antiplatelets: Secondary | ICD-10-CM | POA: Diagnosis not present

## 2022-12-21 DIAGNOSIS — R29898 Other symptoms and signs involving the musculoskeletal system: Secondary | ICD-10-CM | POA: Diagnosis not present

## 2022-12-21 DIAGNOSIS — I69341 Monoplegia of lower limb following cerebral infarction affecting right dominant side: Secondary | ICD-10-CM | POA: Diagnosis not present

## 2022-12-21 DIAGNOSIS — Z9641 Presence of insulin pump (external) (internal): Secondary | ICD-10-CM | POA: Diagnosis not present

## 2022-12-21 DIAGNOSIS — D329 Benign neoplasm of meninges, unspecified: Secondary | ICD-10-CM | POA: Diagnosis not present

## 2022-12-21 DIAGNOSIS — E119 Type 2 diabetes mellitus without complications: Secondary | ICD-10-CM | POA: Diagnosis not present

## 2022-12-21 DIAGNOSIS — I69393 Ataxia following cerebral infarction: Secondary | ICD-10-CM | POA: Diagnosis not present

## 2022-12-21 DIAGNOSIS — I10 Essential (primary) hypertension: Secondary | ICD-10-CM | POA: Diagnosis not present

## 2022-12-21 DIAGNOSIS — Z794 Long term (current) use of insulin: Secondary | ICD-10-CM | POA: Diagnosis not present

## 2022-12-22 DIAGNOSIS — E119 Type 2 diabetes mellitus without complications: Secondary | ICD-10-CM | POA: Diagnosis not present

## 2022-12-22 DIAGNOSIS — Z794 Long term (current) use of insulin: Secondary | ICD-10-CM | POA: Diagnosis not present

## 2022-12-22 DIAGNOSIS — I69393 Ataxia following cerebral infarction: Secondary | ICD-10-CM | POA: Diagnosis not present

## 2022-12-22 DIAGNOSIS — D329 Benign neoplasm of meninges, unspecified: Secondary | ICD-10-CM | POA: Diagnosis not present

## 2022-12-22 DIAGNOSIS — R29898 Other symptoms and signs involving the musculoskeletal system: Secondary | ICD-10-CM | POA: Diagnosis not present

## 2022-12-22 DIAGNOSIS — Z9641 Presence of insulin pump (external) (internal): Secondary | ICD-10-CM | POA: Diagnosis not present

## 2022-12-22 DIAGNOSIS — I69341 Monoplegia of lower limb following cerebral infarction affecting right dominant side: Secondary | ICD-10-CM | POA: Diagnosis not present

## 2022-12-22 DIAGNOSIS — Z7902 Long term (current) use of antithrombotics/antiplatelets: Secondary | ICD-10-CM | POA: Diagnosis not present

## 2022-12-22 DIAGNOSIS — I10 Essential (primary) hypertension: Secondary | ICD-10-CM | POA: Diagnosis not present

## 2022-12-23 DIAGNOSIS — I69341 Monoplegia of lower limb following cerebral infarction affecting right dominant side: Secondary | ICD-10-CM | POA: Diagnosis not present

## 2022-12-23 DIAGNOSIS — Z9641 Presence of insulin pump (external) (internal): Secondary | ICD-10-CM | POA: Diagnosis not present

## 2022-12-23 DIAGNOSIS — D329 Benign neoplasm of meninges, unspecified: Secondary | ICD-10-CM | POA: Diagnosis not present

## 2022-12-23 DIAGNOSIS — R29898 Other symptoms and signs involving the musculoskeletal system: Secondary | ICD-10-CM | POA: Diagnosis not present

## 2022-12-23 DIAGNOSIS — E119 Type 2 diabetes mellitus without complications: Secondary | ICD-10-CM | POA: Diagnosis not present

## 2022-12-23 DIAGNOSIS — I10 Essential (primary) hypertension: Secondary | ICD-10-CM | POA: Diagnosis not present

## 2022-12-23 DIAGNOSIS — Z794 Long term (current) use of insulin: Secondary | ICD-10-CM | POA: Diagnosis not present

## 2022-12-23 DIAGNOSIS — I69393 Ataxia following cerebral infarction: Secondary | ICD-10-CM | POA: Diagnosis not present

## 2022-12-23 DIAGNOSIS — Z7902 Long term (current) use of antithrombotics/antiplatelets: Secondary | ICD-10-CM | POA: Diagnosis not present

## 2022-12-28 DIAGNOSIS — Z9641 Presence of insulin pump (external) (internal): Secondary | ICD-10-CM | POA: Diagnosis not present

## 2022-12-28 DIAGNOSIS — Z794 Long term (current) use of insulin: Secondary | ICD-10-CM | POA: Diagnosis not present

## 2022-12-28 DIAGNOSIS — E119 Type 2 diabetes mellitus without complications: Secondary | ICD-10-CM | POA: Diagnosis not present

## 2022-12-28 DIAGNOSIS — I69341 Monoplegia of lower limb following cerebral infarction affecting right dominant side: Secondary | ICD-10-CM | POA: Diagnosis not present

## 2022-12-28 DIAGNOSIS — R29898 Other symptoms and signs involving the musculoskeletal system: Secondary | ICD-10-CM | POA: Diagnosis not present

## 2022-12-28 DIAGNOSIS — I10 Essential (primary) hypertension: Secondary | ICD-10-CM | POA: Diagnosis not present

## 2022-12-28 DIAGNOSIS — D329 Benign neoplasm of meninges, unspecified: Secondary | ICD-10-CM | POA: Diagnosis not present

## 2022-12-28 DIAGNOSIS — Z7902 Long term (current) use of antithrombotics/antiplatelets: Secondary | ICD-10-CM | POA: Diagnosis not present

## 2022-12-28 DIAGNOSIS — I69393 Ataxia following cerebral infarction: Secondary | ICD-10-CM | POA: Diagnosis not present

## 2022-12-30 DIAGNOSIS — D329 Benign neoplasm of meninges, unspecified: Secondary | ICD-10-CM | POA: Diagnosis not present

## 2022-12-30 DIAGNOSIS — I10 Essential (primary) hypertension: Secondary | ICD-10-CM | POA: Diagnosis not present

## 2022-12-30 DIAGNOSIS — Z7902 Long term (current) use of antithrombotics/antiplatelets: Secondary | ICD-10-CM | POA: Diagnosis not present

## 2022-12-30 DIAGNOSIS — I69341 Monoplegia of lower limb following cerebral infarction affecting right dominant side: Secondary | ICD-10-CM | POA: Diagnosis not present

## 2022-12-30 DIAGNOSIS — Z794 Long term (current) use of insulin: Secondary | ICD-10-CM | POA: Diagnosis not present

## 2022-12-30 DIAGNOSIS — R29898 Other symptoms and signs involving the musculoskeletal system: Secondary | ICD-10-CM | POA: Diagnosis not present

## 2022-12-30 DIAGNOSIS — I69393 Ataxia following cerebral infarction: Secondary | ICD-10-CM | POA: Diagnosis not present

## 2022-12-30 DIAGNOSIS — Z9641 Presence of insulin pump (external) (internal): Secondary | ICD-10-CM | POA: Diagnosis not present

## 2022-12-30 DIAGNOSIS — E119 Type 2 diabetes mellitus without complications: Secondary | ICD-10-CM | POA: Diagnosis not present

## 2023-01-01 DIAGNOSIS — Z7902 Long term (current) use of antithrombotics/antiplatelets: Secondary | ICD-10-CM | POA: Diagnosis not present

## 2023-01-01 DIAGNOSIS — I69393 Ataxia following cerebral infarction: Secondary | ICD-10-CM | POA: Diagnosis not present

## 2023-01-01 DIAGNOSIS — Z794 Long term (current) use of insulin: Secondary | ICD-10-CM | POA: Diagnosis not present

## 2023-01-01 DIAGNOSIS — D329 Benign neoplasm of meninges, unspecified: Secondary | ICD-10-CM | POA: Diagnosis not present

## 2023-01-01 DIAGNOSIS — R29898 Other symptoms and signs involving the musculoskeletal system: Secondary | ICD-10-CM | POA: Diagnosis not present

## 2023-01-01 DIAGNOSIS — E782 Mixed hyperlipidemia: Secondary | ICD-10-CM | POA: Diagnosis not present

## 2023-01-01 DIAGNOSIS — I69341 Monoplegia of lower limb following cerebral infarction affecting right dominant side: Secondary | ICD-10-CM | POA: Diagnosis not present

## 2023-01-01 DIAGNOSIS — I6381 Other cerebral infarction due to occlusion or stenosis of small artery: Secondary | ICD-10-CM | POA: Diagnosis not present

## 2023-01-01 DIAGNOSIS — Z9641 Presence of insulin pump (external) (internal): Secondary | ICD-10-CM | POA: Diagnosis not present

## 2023-01-01 DIAGNOSIS — E119 Type 2 diabetes mellitus without complications: Secondary | ICD-10-CM | POA: Diagnosis not present

## 2023-01-01 DIAGNOSIS — I10 Essential (primary) hypertension: Secondary | ICD-10-CM | POA: Diagnosis not present

## 2023-01-04 DIAGNOSIS — Z794 Long term (current) use of insulin: Secondary | ICD-10-CM | POA: Diagnosis not present

## 2023-01-04 DIAGNOSIS — Z7902 Long term (current) use of antithrombotics/antiplatelets: Secondary | ICD-10-CM | POA: Diagnosis not present

## 2023-01-04 DIAGNOSIS — D329 Benign neoplasm of meninges, unspecified: Secondary | ICD-10-CM | POA: Diagnosis not present

## 2023-01-04 DIAGNOSIS — I69341 Monoplegia of lower limb following cerebral infarction affecting right dominant side: Secondary | ICD-10-CM | POA: Diagnosis not present

## 2023-01-04 DIAGNOSIS — Z9641 Presence of insulin pump (external) (internal): Secondary | ICD-10-CM | POA: Diagnosis not present

## 2023-01-04 DIAGNOSIS — I10 Essential (primary) hypertension: Secondary | ICD-10-CM | POA: Diagnosis not present

## 2023-01-04 DIAGNOSIS — I69393 Ataxia following cerebral infarction: Secondary | ICD-10-CM | POA: Diagnosis not present

## 2023-01-04 DIAGNOSIS — R29898 Other symptoms and signs involving the musculoskeletal system: Secondary | ICD-10-CM | POA: Diagnosis not present

## 2023-01-04 DIAGNOSIS — E119 Type 2 diabetes mellitus without complications: Secondary | ICD-10-CM | POA: Diagnosis not present

## 2023-01-05 IMAGING — MG MM DIGITAL SCREENING BILAT W/ TOMO AND CAD
8 series · 9 of 24 positions shown · non-contrast
Comparison: Previous exam(s).

CLINICAL DATA: Screening.

EXAM:
DIGITAL SCREENING BILATERAL MAMMOGRAM WITH TOMOSYNTHESIS AND CAD
TECHNIQUE: Bilateral screening digital craniocaudal and mediolateral oblique
mammograms were obtained. Bilateral screening digital breast
tomosynthesis was performed. The images were evaluated with
computer-aided detection.

[L CC synth-2D]
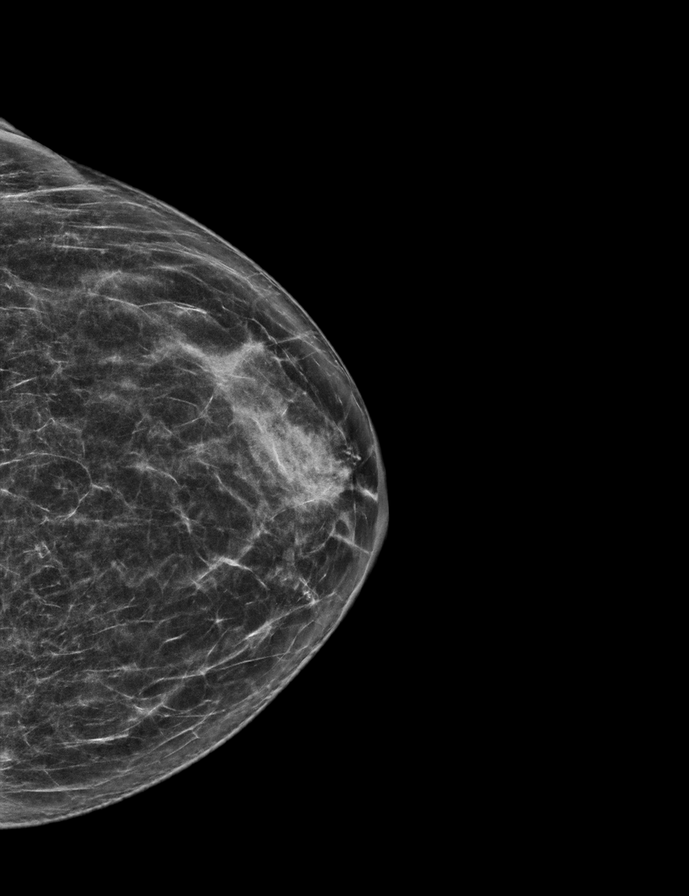

[L MLO synth-2D]
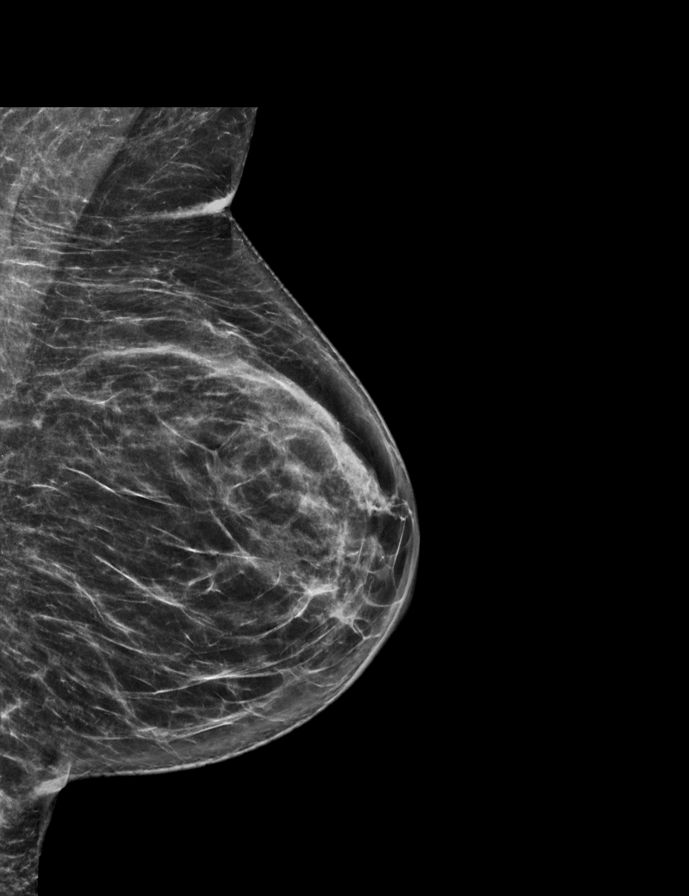

[R MLO synth-2D]
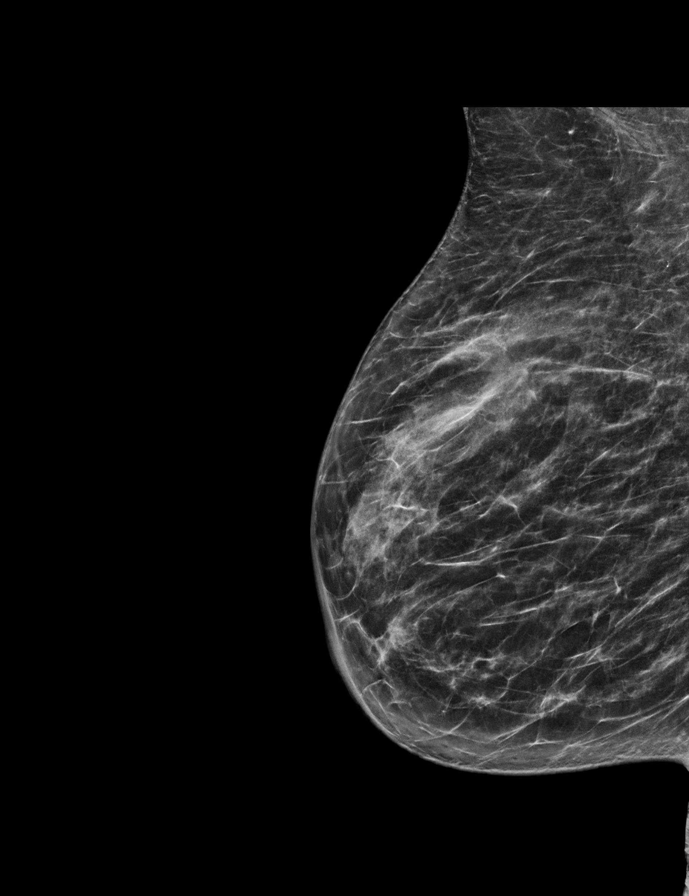

[R CC synth-2D]
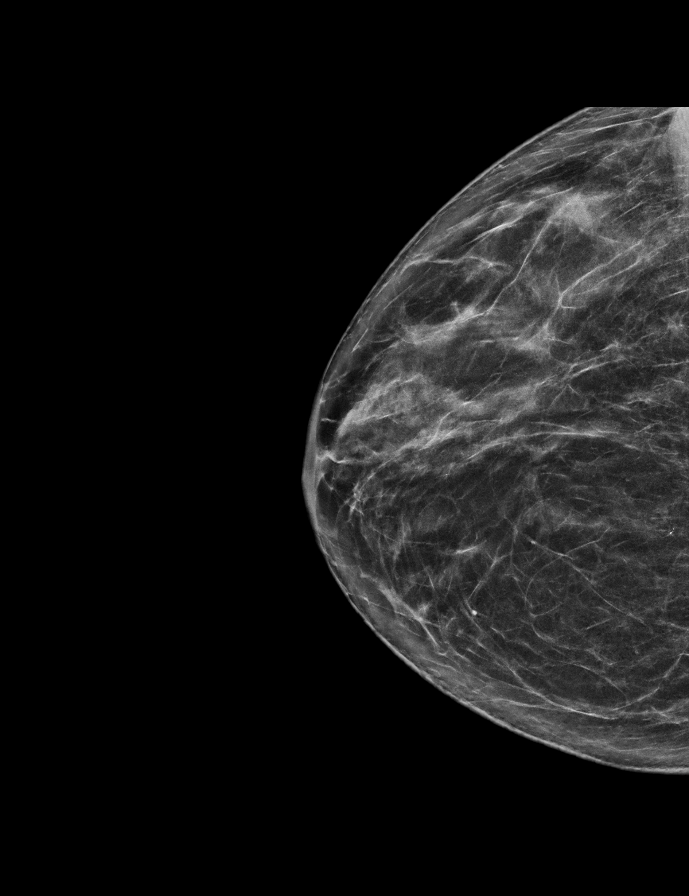

[R CC tomo · 2 of 63 frames shown]
[frame 21/63]
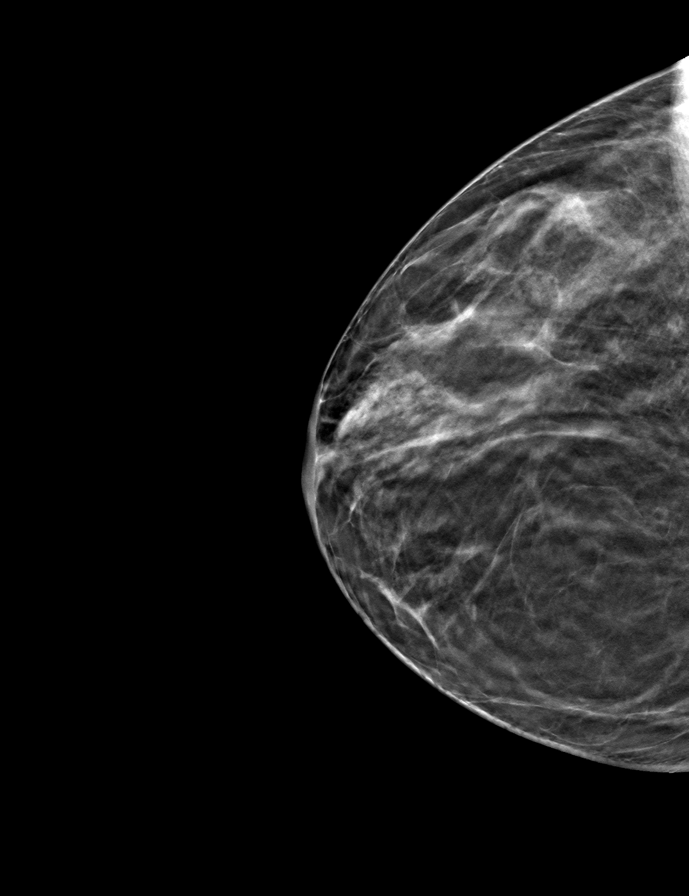
[frame 32/63]
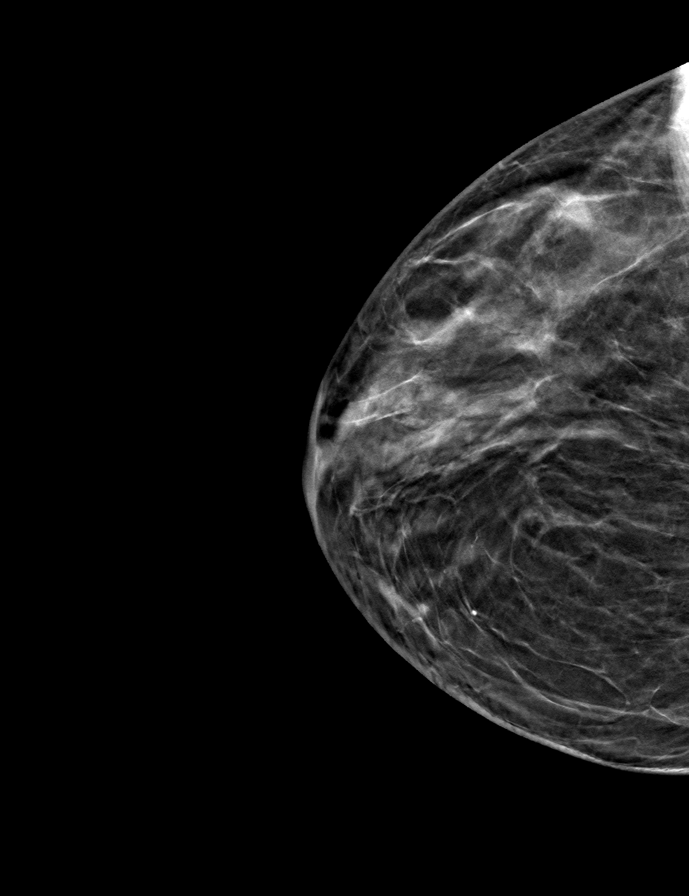

[R MLO tomo · tomo slice 31/62.0]
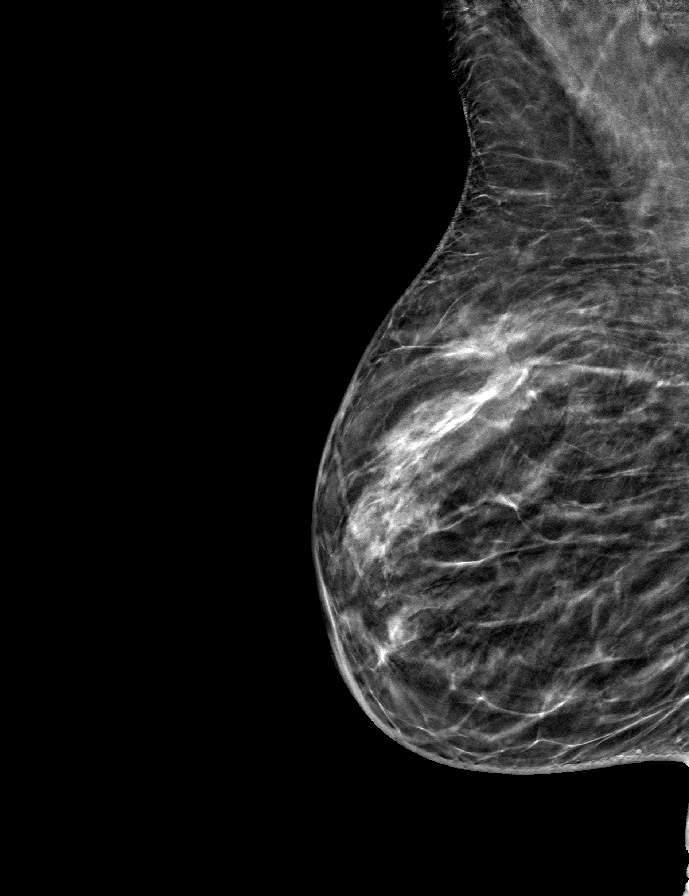

[L MLO tomo · tomo slice 32/63.0]
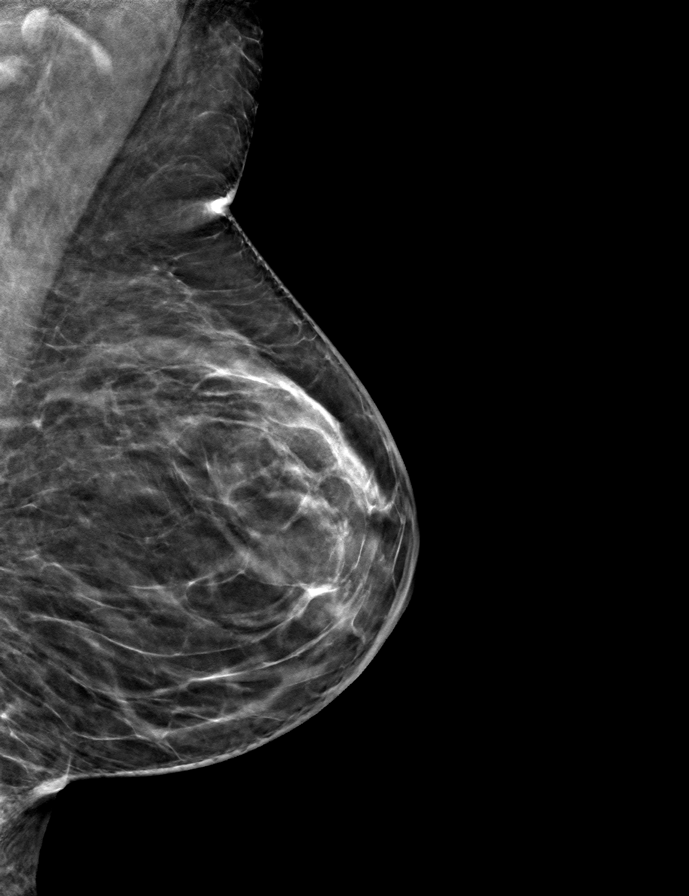

[L CC tomo · tomo slice 31/60.0]
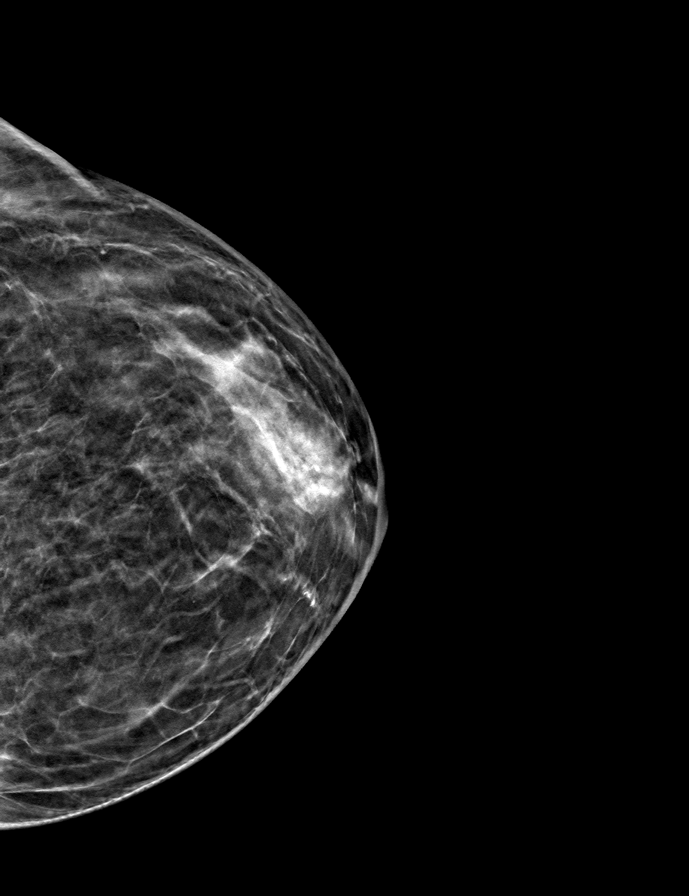

[9 of 24 positions shown; findings below may reference images not displayed]

ACR Breast Density Category c: The breast tissue is heterogeneously
dense, which may obscure small masses.
FINDINGS: There are no findings suspicious for malignancy.
IMPRESSION: No mammographic evidence of malignancy. A result letter of this
screening mammogram will be mailed directly to the patient.

RECOMMENDATION:
Screening mammogram in one year. (Code:Q3-W-BC3)

BI-RADS CATEGORY  1: Negative.

## 2023-01-08 DIAGNOSIS — D329 Benign neoplasm of meninges, unspecified: Secondary | ICD-10-CM | POA: Diagnosis not present

## 2023-01-08 DIAGNOSIS — I69393 Ataxia following cerebral infarction: Secondary | ICD-10-CM | POA: Diagnosis not present

## 2023-01-08 DIAGNOSIS — E119 Type 2 diabetes mellitus without complications: Secondary | ICD-10-CM | POA: Diagnosis not present

## 2023-01-08 DIAGNOSIS — I10 Essential (primary) hypertension: Secondary | ICD-10-CM | POA: Diagnosis not present

## 2023-01-08 DIAGNOSIS — R29898 Other symptoms and signs involving the musculoskeletal system: Secondary | ICD-10-CM | POA: Diagnosis not present

## 2023-01-08 DIAGNOSIS — I69341 Monoplegia of lower limb following cerebral infarction affecting right dominant side: Secondary | ICD-10-CM | POA: Diagnosis not present

## 2023-01-08 DIAGNOSIS — Z794 Long term (current) use of insulin: Secondary | ICD-10-CM | POA: Diagnosis not present

## 2023-01-08 DIAGNOSIS — Z9641 Presence of insulin pump (external) (internal): Secondary | ICD-10-CM | POA: Diagnosis not present

## 2023-01-08 DIAGNOSIS — Z7902 Long term (current) use of antithrombotics/antiplatelets: Secondary | ICD-10-CM | POA: Diagnosis not present

## 2023-01-11 DIAGNOSIS — Z7902 Long term (current) use of antithrombotics/antiplatelets: Secondary | ICD-10-CM | POA: Diagnosis not present

## 2023-01-11 DIAGNOSIS — Z794 Long term (current) use of insulin: Secondary | ICD-10-CM | POA: Diagnosis not present

## 2023-01-11 DIAGNOSIS — E119 Type 2 diabetes mellitus without complications: Secondary | ICD-10-CM | POA: Diagnosis not present

## 2023-01-11 DIAGNOSIS — R29898 Other symptoms and signs involving the musculoskeletal system: Secondary | ICD-10-CM | POA: Diagnosis not present

## 2023-01-11 DIAGNOSIS — I69393 Ataxia following cerebral infarction: Secondary | ICD-10-CM | POA: Diagnosis not present

## 2023-01-11 DIAGNOSIS — D329 Benign neoplasm of meninges, unspecified: Secondary | ICD-10-CM | POA: Diagnosis not present

## 2023-01-11 DIAGNOSIS — Z9641 Presence of insulin pump (external) (internal): Secondary | ICD-10-CM | POA: Diagnosis not present

## 2023-01-11 DIAGNOSIS — I69341 Monoplegia of lower limb following cerebral infarction affecting right dominant side: Secondary | ICD-10-CM | POA: Diagnosis not present

## 2023-01-11 DIAGNOSIS — I10 Essential (primary) hypertension: Secondary | ICD-10-CM | POA: Diagnosis not present

## 2023-01-12 DIAGNOSIS — E782 Mixed hyperlipidemia: Secondary | ICD-10-CM | POA: Diagnosis not present

## 2023-01-18 DIAGNOSIS — E1065 Type 1 diabetes mellitus with hyperglycemia: Secondary | ICD-10-CM | POA: Diagnosis not present

## 2023-01-18 DIAGNOSIS — R5383 Other fatigue: Secondary | ICD-10-CM | POA: Diagnosis not present

## 2023-01-18 DIAGNOSIS — E559 Vitamin D deficiency, unspecified: Secondary | ICD-10-CM | POA: Diagnosis not present

## 2023-01-19 DIAGNOSIS — I69393 Ataxia following cerebral infarction: Secondary | ICD-10-CM | POA: Diagnosis not present

## 2023-01-19 DIAGNOSIS — Z794 Long term (current) use of insulin: Secondary | ICD-10-CM | POA: Diagnosis not present

## 2023-01-19 DIAGNOSIS — E119 Type 2 diabetes mellitus without complications: Secondary | ICD-10-CM | POA: Diagnosis not present

## 2023-01-19 DIAGNOSIS — Z7902 Long term (current) use of antithrombotics/antiplatelets: Secondary | ICD-10-CM | POA: Diagnosis not present

## 2023-01-19 DIAGNOSIS — D329 Benign neoplasm of meninges, unspecified: Secondary | ICD-10-CM | POA: Diagnosis not present

## 2023-01-19 DIAGNOSIS — R29898 Other symptoms and signs involving the musculoskeletal system: Secondary | ICD-10-CM | POA: Diagnosis not present

## 2023-01-19 DIAGNOSIS — Z9641 Presence of insulin pump (external) (internal): Secondary | ICD-10-CM | POA: Diagnosis not present

## 2023-01-19 DIAGNOSIS — I10 Essential (primary) hypertension: Secondary | ICD-10-CM | POA: Diagnosis not present

## 2023-01-19 DIAGNOSIS — I69341 Monoplegia of lower limb following cerebral infarction affecting right dominant side: Secondary | ICD-10-CM | POA: Diagnosis not present

## 2023-01-28 DIAGNOSIS — I69393 Ataxia following cerebral infarction: Secondary | ICD-10-CM | POA: Diagnosis not present

## 2023-01-28 DIAGNOSIS — D329 Benign neoplasm of meninges, unspecified: Secondary | ICD-10-CM | POA: Diagnosis not present

## 2023-01-28 DIAGNOSIS — Z7902 Long term (current) use of antithrombotics/antiplatelets: Secondary | ICD-10-CM | POA: Diagnosis not present

## 2023-01-28 DIAGNOSIS — I10 Essential (primary) hypertension: Secondary | ICD-10-CM | POA: Diagnosis not present

## 2023-01-28 DIAGNOSIS — E119 Type 2 diabetes mellitus without complications: Secondary | ICD-10-CM | POA: Diagnosis not present

## 2023-01-28 DIAGNOSIS — Z9641 Presence of insulin pump (external) (internal): Secondary | ICD-10-CM | POA: Diagnosis not present

## 2023-01-28 DIAGNOSIS — I69341 Monoplegia of lower limb following cerebral infarction affecting right dominant side: Secondary | ICD-10-CM | POA: Diagnosis not present

## 2023-01-28 DIAGNOSIS — Z794 Long term (current) use of insulin: Secondary | ICD-10-CM | POA: Diagnosis not present

## 2023-01-28 DIAGNOSIS — R29898 Other symptoms and signs involving the musculoskeletal system: Secondary | ICD-10-CM | POA: Diagnosis not present

## 2023-02-03 DIAGNOSIS — E1069 Type 1 diabetes mellitus with other specified complication: Secondary | ICD-10-CM | POA: Diagnosis not present

## 2023-02-03 DIAGNOSIS — I6350 Cerebral infarction due to unspecified occlusion or stenosis of unspecified cerebral artery: Secondary | ICD-10-CM | POA: Diagnosis not present

## 2023-02-03 DIAGNOSIS — E1042 Type 1 diabetes mellitus with diabetic polyneuropathy: Secondary | ICD-10-CM | POA: Diagnosis not present

## 2023-02-16 DIAGNOSIS — E109 Type 1 diabetes mellitus without complications: Secondary | ICD-10-CM | POA: Diagnosis not present

## 2023-02-22 DIAGNOSIS — E1040 Type 1 diabetes mellitus with diabetic neuropathy, unspecified: Secondary | ICD-10-CM | POA: Diagnosis not present

## 2023-02-22 DIAGNOSIS — E785 Hyperlipidemia, unspecified: Secondary | ICD-10-CM | POA: Diagnosis not present

## 2023-02-22 DIAGNOSIS — K56600 Partial intestinal obstruction, unspecified as to cause: Secondary | ICD-10-CM | POA: Diagnosis not present

## 2023-02-22 DIAGNOSIS — Z794 Long term (current) use of insulin: Secondary | ICD-10-CM | POA: Diagnosis not present

## 2023-02-22 DIAGNOSIS — E103599 Type 1 diabetes mellitus with proliferative diabetic retinopathy without macular edema, unspecified eye: Secondary | ICD-10-CM | POA: Diagnosis not present

## 2023-02-22 DIAGNOSIS — K56609 Unspecified intestinal obstruction, unspecified as to partial versus complete obstruction: Secondary | ICD-10-CM | POA: Diagnosis not present

## 2023-02-22 DIAGNOSIS — R9431 Abnormal electrocardiogram [ECG] [EKG]: Secondary | ICD-10-CM | POA: Diagnosis not present

## 2023-02-22 DIAGNOSIS — E119 Type 2 diabetes mellitus without complications: Secondary | ICD-10-CM | POA: Diagnosis not present

## 2023-02-22 DIAGNOSIS — R1013 Epigastric pain: Secondary | ICD-10-CM | POA: Diagnosis not present

## 2023-02-22 DIAGNOSIS — I69341 Monoplegia of lower limb following cerebral infarction affecting right dominant side: Secondary | ICD-10-CM | POA: Diagnosis not present

## 2023-02-22 DIAGNOSIS — R1110 Vomiting, unspecified: Secondary | ICD-10-CM | POA: Diagnosis not present

## 2023-02-22 DIAGNOSIS — E1069 Type 1 diabetes mellitus with other specified complication: Secondary | ICD-10-CM | POA: Diagnosis not present

## 2023-02-22 DIAGNOSIS — I10 Essential (primary) hypertension: Secondary | ICD-10-CM | POA: Diagnosis not present

## 2023-02-22 DIAGNOSIS — Z7982 Long term (current) use of aspirin: Secondary | ICD-10-CM | POA: Diagnosis not present

## 2023-03-08 DIAGNOSIS — I10 Essential (primary) hypertension: Secondary | ICD-10-CM | POA: Diagnosis not present

## 2023-03-08 DIAGNOSIS — D649 Anemia, unspecified: Secondary | ICD-10-CM | POA: Diagnosis not present

## 2023-03-08 DIAGNOSIS — K56609 Unspecified intestinal obstruction, unspecified as to partial versus complete obstruction: Secondary | ICD-10-CM | POA: Diagnosis not present

## 2023-03-08 DIAGNOSIS — E1049 Type 1 diabetes mellitus with other diabetic neurological complication: Secondary | ICD-10-CM | POA: Diagnosis not present

## 2023-03-08 DIAGNOSIS — R5383 Other fatigue: Secondary | ICD-10-CM | POA: Diagnosis not present

## 2023-03-08 DIAGNOSIS — R4189 Other symptoms and signs involving cognitive functions and awareness: Secondary | ICD-10-CM | POA: Diagnosis not present

## 2023-03-17 DIAGNOSIS — E109 Type 1 diabetes mellitus without complications: Secondary | ICD-10-CM | POA: Diagnosis not present

## 2023-03-19 DIAGNOSIS — E109 Type 1 diabetes mellitus without complications: Secondary | ICD-10-CM | POA: Diagnosis not present

## 2023-04-12 DIAGNOSIS — M40203 Unspecified kyphosis, cervicothoracic region: Secondary | ICD-10-CM | POA: Diagnosis not present

## 2023-04-12 DIAGNOSIS — I10 Essential (primary) hypertension: Secondary | ICD-10-CM | POA: Diagnosis not present

## 2023-04-12 DIAGNOSIS — M810 Age-related osteoporosis without current pathological fracture: Secondary | ICD-10-CM | POA: Diagnosis not present

## 2023-04-12 DIAGNOSIS — Z0001 Encounter for general adult medical examination with abnormal findings: Secondary | ICD-10-CM | POA: Diagnosis not present

## 2023-04-12 DIAGNOSIS — I773 Arterial fibromuscular dysplasia: Secondary | ICD-10-CM | POA: Diagnosis not present

## 2023-04-12 DIAGNOSIS — E1049 Type 1 diabetes mellitus with other diabetic neurological complication: Secondary | ICD-10-CM | POA: Diagnosis not present

## 2023-04-18 DIAGNOSIS — E109 Type 1 diabetes mellitus without complications: Secondary | ICD-10-CM | POA: Diagnosis not present

## 2023-04-21 DIAGNOSIS — H43822 Vitreomacular adhesion, left eye: Secondary | ICD-10-CM | POA: Diagnosis not present

## 2023-04-21 DIAGNOSIS — S0571XD Avulsion of right eye, subsequent encounter: Secondary | ICD-10-CM | POA: Diagnosis not present

## 2023-04-21 DIAGNOSIS — H35372 Puckering of macula, left eye: Secondary | ICD-10-CM | POA: Diagnosis not present

## 2023-04-21 DIAGNOSIS — H353121 Nonexudative age-related macular degeneration, left eye, early dry stage: Secondary | ICD-10-CM | POA: Diagnosis not present

## 2023-04-21 DIAGNOSIS — E103552 Type 1 diabetes mellitus with stable proliferative diabetic retinopathy, left eye: Secondary | ICD-10-CM | POA: Diagnosis not present

## 2023-04-26 DIAGNOSIS — D649 Anemia, unspecified: Secondary | ICD-10-CM | POA: Diagnosis not present

## 2023-04-26 DIAGNOSIS — R4189 Other symptoms and signs involving cognitive functions and awareness: Secondary | ICD-10-CM | POA: Diagnosis not present

## 2023-04-26 DIAGNOSIS — R5383 Other fatigue: Secondary | ICD-10-CM | POA: Diagnosis not present

## 2023-05-06 DIAGNOSIS — E1065 Type 1 diabetes mellitus with hyperglycemia: Secondary | ICD-10-CM | POA: Diagnosis not present

## 2023-05-06 DIAGNOSIS — I6350 Cerebral infarction due to unspecified occlusion or stenosis of unspecified cerebral artery: Secondary | ICD-10-CM | POA: Diagnosis not present

## 2023-05-06 DIAGNOSIS — E133519 Other specified diabetes mellitus with proliferative diabetic retinopathy with macular edema, unspecified eye: Secondary | ICD-10-CM | POA: Diagnosis not present

## 2023-05-06 DIAGNOSIS — E1042 Type 1 diabetes mellitus with diabetic polyneuropathy: Secondary | ICD-10-CM | POA: Diagnosis not present

## 2023-05-06 DIAGNOSIS — M8000XS Age-related osteoporosis with current pathological fracture, unspecified site, sequela: Secondary | ICD-10-CM | POA: Diagnosis not present

## 2023-05-19 DIAGNOSIS — E109 Type 1 diabetes mellitus without complications: Secondary | ICD-10-CM | POA: Diagnosis not present

## 2023-06-10 DIAGNOSIS — E1069 Type 1 diabetes mellitus with other specified complication: Secondary | ICD-10-CM | POA: Diagnosis not present

## 2023-06-19 DIAGNOSIS — E109 Type 1 diabetes mellitus without complications: Secondary | ICD-10-CM | POA: Diagnosis not present

## 2023-07-07 DIAGNOSIS — I6381 Other cerebral infarction due to occlusion or stenosis of small artery: Secondary | ICD-10-CM | POA: Diagnosis not present

## 2023-07-19 DIAGNOSIS — E109 Type 1 diabetes mellitus without complications: Secondary | ICD-10-CM | POA: Diagnosis not present

## 2023-07-22 ENCOUNTER — Other Ambulatory Visit: Payer: Self-pay | Admitting: Family Medicine

## 2023-07-22 ENCOUNTER — Other Ambulatory Visit: Payer: Self-pay | Admitting: *Deleted

## 2023-07-22 DIAGNOSIS — Z1231 Encounter for screening mammogram for malignant neoplasm of breast: Secondary | ICD-10-CM

## 2023-07-26 DIAGNOSIS — E109 Type 1 diabetes mellitus without complications: Secondary | ICD-10-CM | POA: Diagnosis not present

## 2023-08-09 DIAGNOSIS — I10 Essential (primary) hypertension: Secondary | ICD-10-CM | POA: Diagnosis not present

## 2023-08-09 DIAGNOSIS — D329 Benign neoplasm of meninges, unspecified: Secondary | ICD-10-CM | POA: Diagnosis not present

## 2023-08-09 DIAGNOSIS — Z1231 Encounter for screening mammogram for malignant neoplasm of breast: Secondary | ICD-10-CM | POA: Diagnosis not present

## 2023-08-09 DIAGNOSIS — E1049 Type 1 diabetes mellitus with other diabetic neurological complication: Secondary | ICD-10-CM | POA: Diagnosis not present

## 2023-08-09 DIAGNOSIS — I6381 Other cerebral infarction due to occlusion or stenosis of small artery: Secondary | ICD-10-CM | POA: Diagnosis not present

## 2023-08-19 DIAGNOSIS — E109 Type 1 diabetes mellitus without complications: Secondary | ICD-10-CM | POA: Diagnosis not present

## 2023-08-30 ENCOUNTER — Ambulatory Visit
Admission: RE | Admit: 2023-08-30 | Discharge: 2023-08-30 | Disposition: A | Discharge status: Home / Self Care | Payer: Medicare PPO | Source: Ambulatory Visit

## 2023-08-30 DIAGNOSIS — Z1231 Encounter for screening mammogram for malignant neoplasm of breast: Secondary | ICD-10-CM
